# Patient Record
Sex: Male | Born: 2005 | Race: Black or African American | Hispanic: No | Marital: Single
Health system: Southern US, Community
[De-identification: ages and names within clinical notes are randomized; demographics above are authoritative.]

## PROBLEM LIST (undated history)

## (undated) DIAGNOSIS — L309 Dermatitis, unspecified: Secondary | ICD-10-CM

## (undated) DIAGNOSIS — J302 Other seasonal allergic rhinitis: Secondary | ICD-10-CM

## (undated) HISTORY — PX: CIRCUMCISION: SUR203

---
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
## 2023-03-31 ENCOUNTER — Emergency Department (HOSPITAL_COMMUNITY)
Admission: EM | Admit: 2023-03-31 | Discharge: 2023-04-01 | Disposition: A | Discharge status: Home / Self Care | Payer: No Typology Code available for payment source | Attending: Emergency Medicine | Admitting: Emergency Medicine

## 2023-03-31 ENCOUNTER — Encounter (HOSPITAL_COMMUNITY): Payer: Self-pay

## 2023-03-31 ENCOUNTER — Other Ambulatory Visit: Payer: Self-pay

## 2023-03-31 ENCOUNTER — Emergency Department (HOSPITAL_COMMUNITY): Payer: No Typology Code available for payment source

## 2023-03-31 DIAGNOSIS — S80211A Abrasion, right knee, initial encounter: Secondary | ICD-10-CM | POA: Diagnosis not present

## 2023-03-31 DIAGNOSIS — Y92410 Unspecified street and highway as the place of occurrence of the external cause: Secondary | ICD-10-CM | POA: Diagnosis not present

## 2023-03-31 DIAGNOSIS — S8991XA Unspecified injury of right lower leg, initial encounter: Secondary | ICD-10-CM | POA: Diagnosis present
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
## 2023-04-01 DIAGNOSIS — S80211A Abrasion, right knee, initial encounter: Secondary | ICD-10-CM | POA: Diagnosis not present

## 2023-04-01 MED ORDER — BACITRACIN ZINC 500 UNIT/GM EX OINT
TOPICAL_OINTMENT | Freq: Once | CUTANEOUS | Status: AC
  Filled 2023-04-01: qty 0.9

## 2023-04-01 NOTE — Discharge Instructions (Addendum)
Motrin and Tylenol as needed as directed. Keep wound clean and dry. Recheck with your doctor for symptoms not improving after 3-5 days.
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
## 2024-08-18 ENCOUNTER — Encounter (HOSPITAL_COMMUNITY): Payer: Self-pay | Admitting: Emergency Medicine

## 2024-08-18 ENCOUNTER — Emergency Department (HOSPITAL_COMMUNITY)
Admission: EM | Admit: 2024-08-18 | Discharge: 2024-08-18 | Discharge status: Against Medical Advice | Payer: MEDICAID | Attending: Emergency Medicine | Admitting: Emergency Medicine

## 2024-08-18 ENCOUNTER — Emergency Department (HOSPITAL_COMMUNITY)
Admission: EM | Admit: 2024-08-18 | Discharge: 2024-08-18 | Discharge status: Against Medical Advice | Payer: MEDICAID | Source: Home / Self Care

## 2024-08-18 ENCOUNTER — Other Ambulatory Visit: Payer: Self-pay

## 2024-08-18 DIAGNOSIS — R1110 Vomiting, unspecified: Secondary | ICD-10-CM | POA: Insufficient documentation

## 2024-08-18 DIAGNOSIS — M7918 Myalgia, other site: Secondary | ICD-10-CM | POA: Insufficient documentation

## 2024-08-18 DIAGNOSIS — Z5321 Procedure and treatment not carried out due to patient leaving prior to being seen by health care provider: Secondary | ICD-10-CM | POA: Insufficient documentation

## 2024-08-18 DIAGNOSIS — Y900 Blood alcohol level of less than 20 mg/100 ml: Secondary | ICD-10-CM | POA: Diagnosis not present

## 2024-08-18 DIAGNOSIS — F1213 Cannabis abuse with withdrawal: Secondary | ICD-10-CM | POA: Insufficient documentation

## 2024-08-18 DIAGNOSIS — R0789 Other chest pain: Secondary | ICD-10-CM | POA: Diagnosis not present

## 2024-08-18 LAB — COMPREHENSIVE METABOLIC PANEL WITH GFR
ALT: 28 U/L (ref 0–44)
AST: 24 U/L (ref 15–41)
Albumin: 4.7 g/dL (ref 3.5–5.0)
Alkaline Phosphatase: 43 U/L (ref 38–126)
Anion gap: 11 (ref 5–15)
BUN: 10 mg/dL (ref 6–20)
CO2: 24 mmol/L (ref 22–32)
Calcium: 10.1 mg/dL (ref 8.9–10.3)
Chloride: 105 mmol/L (ref 98–111)
Creatinine, Ser: 1.03 mg/dL (ref 0.61–1.24)
GFR, Estimated: >60 mL/min (ref >60)
Glucose, Bld: 103 mg/dL — ABNORMAL HIGH (ref 70–99)
Potassium: 4.8 mmol/L (ref 3.5–5.1)
Sodium: 140 mmol/L (ref 135–145)
Total Bilirubin: 0.8 mg/dL (ref 0.0–1.2)
Total Protein: 7.7 g/dL (ref 6.5–8.1)

## 2024-08-18 LAB — CBC
HCT: 52.5 % — ABNORMAL HIGH (ref 39.0–52.0)
Hemoglobin: 17.1 g/dL — ABNORMAL HIGH (ref 13.0–17.0)
MCH: 31 pg (ref 26.0–34.0)
MCHC: 32.6 g/dL (ref 30.0–36.0)
MCV: 95.1 fL (ref 80.0–100.0)
Platelets: 219 K/uL (ref 150–400)
RBC: 5.52 MIL/uL (ref 4.22–5.81)
RDW: 12.8 % (ref 11.5–15.5)
WBC: 7.9 K/uL (ref 4.0–10.5)
nRBC: 0 % (ref 0.0–0.2)

## 2024-08-18 LAB — URINALYSIS, ROUTINE W REFLEX MICROSCOPIC
Bilirubin Urine: NEGATIVE
Glucose, UA: NEGATIVE mg/dL
Hgb urine dipstick: NEGATIVE
Ketones, ur: 20 mg/dL — AB
Leukocytes,Ua: NEGATIVE
Nitrite: NEGATIVE
Protein, ur: NEGATIVE mg/dL
Specific Gravity, Urine: 1.013 (ref 1.005–1.030)
pH: 9 — ABNORMAL HIGH (ref 5.0–8.0)

## 2024-08-18 LAB — RAPID URINE DRUG SCREEN, HOSP PERFORMED
Amphetamines: NOT DETECTED
Barbiturates: NOT DETECTED
Benzodiazepines: NOT DETECTED
Cocaine: NOT DETECTED
Opiates: NOT DETECTED
Tetrahydrocannabinol: POSITIVE — AB

## 2024-08-18 LAB — LIPASE, BLOOD: Lipase: 36 U/L (ref 11–51)

## 2024-08-18 LAB — ETHANOL: Alcohol, Ethyl (B): <15 mg/dL (ref <15)

## 2024-08-18 NOTE — ED Triage Notes (Signed)
 Pt to ER with c/o body aches, thinks he is withdrawing from Percocet.  Pt states last used yesterday.

## 2024-08-18 NOTE — ED Notes (Signed)
 Patient stated he just want to go home . RN explained about the importance to be seen by Dr. But patient ignored RN and left.

## 2024-08-18 NOTE — ED Triage Notes (Signed)
 Patient c/o generalized body aches and emesis x 4 today. Family report patient might be withdrawing to percocet. Patient reported taking Percocet yesterday, which he acquired from the street.

## 2024-08-18 NOTE — ED Notes (Signed)
 Pt states he is leaving and going to [place] with mom accompanying him

## 2024-08-18 NOTE — ED Triage Notes (Signed)
 Pt to ER with c/o generalized body aches, vomiting, chest discomfort.  Pt states he believes he is withdrawing from percocet.  States he takes 1 a day that he takes off the street.  States last used on yesterday.  Pt denies other drug use.
